# Patient Record
Sex: Male | Born: 1955 | Race: Black or African American | Hispanic: No
Health system: Southern US, Community
[De-identification: ages and names within clinical notes are randomized; demographics above are authoritative.]

## PROBLEM LIST (undated history)

## (undated) DIAGNOSIS — I10 Essential (primary) hypertension: Secondary | ICD-10-CM

## (undated) DIAGNOSIS — E7800 Pure hypercholesterolemia, unspecified: Secondary | ICD-10-CM

## (undated) HISTORY — DX: Essential (primary) hypertension: I10

## (undated) HISTORY — DX: Pure hypercholesterolemia, unspecified: E78.00

---

## 2009-09-17 ENCOUNTER — Inpatient Hospital Stay (HOSPITAL_COMMUNITY): Admission: EM | Admit: 2009-09-17 | Discharge: 2009-09-20 | Payer: Self-pay | Admitting: Emergency Medicine

## 2009-10-16 ENCOUNTER — Ambulatory Visit (HOSPITAL_COMMUNITY): Admission: RE | Admit: 2009-10-16 | Discharge: 2009-10-16 | Payer: Self-pay | Admitting: Neurosurgery
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
## 2011-02-20 LAB — BASIC METABOLIC PANEL
BUN: 16 mg/dL (ref 6–23)
Chloride: 104 mEq/L (ref 96–112)
Sodium: 135 mEq/L (ref 135–145)

## 2011-02-20 LAB — CBC: RDW: 14.5 % (ref 11.5–15.5)

## 2011-02-21 LAB — DIFFERENTIAL
Basophils Absolute: 0 10*3/uL (ref 0.0–0.1)
Basophils Relative: 0 % (ref 0–1)
Eosinophils Absolute: 0.1 10*3/uL (ref 0.0–0.7)
Lymphocytes Relative: 20 % (ref 12–46)
Monocytes Relative: 4 % (ref 3–12)
Neutro Abs: 8.2 10*3/uL — ABNORMAL HIGH (ref 1.7–7.7)
Neutrophils Relative %: 75 % (ref 43–77)

## 2011-02-21 LAB — POCT I-STAT, CHEM 8
Chloride: 102 mEq/L (ref 96–112)
Creatinine, Ser: 1.2 mg/dL (ref 0.4–1.5)
HCT: 48 % (ref 39.0–52.0)
Hemoglobin: 16.3 g/dL (ref 13.0–17.0)
TCO2: 27 mmol/L (ref 0–100)

## 2011-02-21 LAB — CBC
Hemoglobin: 14.8 g/dL (ref 13.0–17.0)
MCHC: 33.5 g/dL (ref 30.0–36.0)
Platelets: 138 10*3/uL — ABNORMAL LOW (ref 150–400)
RBC: 4.92 MIL/uL (ref 4.22–5.81)
RDW: 14.3 % (ref 11.5–15.5)
WBC: 11 10*3/uL — ABNORMAL HIGH (ref 4.0–10.5)

## 2011-02-21 LAB — PROTIME-INR: Prothrombin Time: 13.7 seconds (ref 11.6–15.2)

## 2018-07-29 ENCOUNTER — Encounter: Payer: Self-pay | Admitting: Gastroenterology

## 2018-09-07 ENCOUNTER — Ambulatory Visit: Payer: 59 | Admitting: Gastroenterology

## 2018-09-07 ENCOUNTER — Encounter: Payer: Self-pay | Admitting: Gastroenterology

## 2018-09-07 VITALS — BP 140/90 | HR 86 | Ht 74.0 in | Wt 91.4 lb

## 2018-09-07 DIAGNOSIS — R195 Other fecal abnormalities: Secondary | ICD-10-CM

## 2018-09-07 NOTE — Progress Notes (Signed)
Referring Provider: No ref. provider found Primary Care Physician:  Kaleen Mask, MD   Reason for Consultation:  Hemosure positive   IMPRESSION:  Hemosure positive No prior colon cancer screening  PLAN: Colonoscopy  I consented the patient at the bedside today discussing the risks, benefits, and alternatives to endoscopic evaluation. In particular, we discussed the risks that include, but are not limited to, reaction to medication, cardiopulmonary compromise, bleeding requiring blood transfusion, aspiration resulting in pneumonia, perforation requiring surgery, and even death. We reviewed the risk of missed lesion including polyps or even cancer. The patient acknowledges these risks and asks that we proceed.   HPI: Julian Hartman is a 62 y.o. male seen in consultation at the request of Dr. Jeannetta Nap. The history is obtained through the patient and review of his electronic health record.  He was found to have guaiac positive stools. He has no localizing signs or symptoms. GI ROS is negative.    Feels he is in good health. Rides his bike 30 miles each day.   No famiy history of coon cancer or colon polyps. No recent labs are available.   Past Medical History:  Diagnosis Date  . Elevated cholesterol   . Hypertension     History reviewed. No pertinent surgical history.  Prior to Admission medications   Not on File    Current Outpatient Medications  Medication Sig Dispense Refill  . lisinopril (PRINIVIL,ZESTRIL) 30 MG tablet Take 30 mg by mouth daily.    Marland Kitchen lovastatin (MEVACOR) 20 MG tablet Take 20 mg by mouth at bedtime.     No current facility-administered medications for this visit.     Allergies as of 09/07/2018 - Review Complete 09/07/2018  Allergen Reaction Noted  . Tusstat [diphenhydramine]  09/07/2018    History reviewed. No pertinent family history.  Social History   Socioeconomic History  . Marital status: Unknown    Spouse name: Not on file  .  Number of children: Not on file  . Years of education: Not on file  . Highest education level: Not on file  Occupational History  . Not on file  Social Needs  . Financial resource strain: Not on file  . Food insecurity:    Worry: Not on file    Inability: Not on file  . Transportation needs:    Medical: Not on file    Non-medical: Not on file  Tobacco Use  . Smoking status: Not on file  Substance and Sexual Activity  . Alcohol use: Not on file  . Drug use: Not on file  . Sexual activity: Not on file  Lifestyle  . Physical activity:    Days per week: Not on file    Minutes per session: Not on file  . Stress: Not on file  Relationships  . Social connections:    Talks on phone: Not on file    Gets together: Not on file    Attends religious service: Not on file    Active member of club or organization: Not on file    Attends meetings of clubs or organizations: Not on file    Relationship status: Not on file  . Intimate partner violence:    Fear of current or ex partner: Not on file    Emotionally abused: Not on file    Physically abused: Not on file    Forced sexual activity: Not on file  Other Topics Concern  . Not on file  Social History Narrative  . Not  on file    Review of Systems: 12 system ROS is negative except as noted above except for back pain.   Physical Exam:   General:   Alert, well-nourished, pleasant and cooperative in NAD Head:  Normocephalic and atraumatic. Eyes:  Sclera clear, no icterus.   Conjunctiva pink. Mouth:  No deformity or lesions.   Neck:  Supple; no thyromegaly. Lungs:  Clear throughout to auscultation.   No wheezes.  Heart:  Regular rate and rhythm; no murmurs Abdomen:  Soft, nontender, normal bowel sounds. No rebound or guarding. No hepatosplenomegaly Rectal:  Deferred  Msk:  Symmetrical without gross deformities. Extremities:  No gross deformities or edema. Neurologic:  Alert and  oriented x4;  grossly nonfocal Skin:  No rash or  bruise. Psych:  Alert and cooperative. Normal mood and affect.    Koron Godeaux L. Orvan Falconer Md, MPH  Gastroenterology 09/08/2018, 7:01 AM

## 2018-09-07 NOTE — Patient Instructions (Signed)

## 2018-09-14 ENCOUNTER — Encounter: Payer: Self-pay | Admitting: Gastroenterology

## 2018-09-28 ENCOUNTER — Ambulatory Visit (AMBULATORY_SURGERY_CENTER): Payer: 59 | Admitting: Gastroenterology

## 2018-09-28 ENCOUNTER — Encounter: Payer: Self-pay | Admitting: Gastroenterology

## 2018-09-28 VITALS — BP 143/75 | HR 55 | Temp 98.0°F | Resp 25 | Ht 74.0 in | Wt 185.0 lb

## 2018-09-28 DIAGNOSIS — K2970 Gastritis, unspecified, without bleeding: Secondary | ICD-10-CM

## 2018-09-28 DIAGNOSIS — K5730 Diverticulosis of large intestine without perforation or abscess without bleeding: Secondary | ICD-10-CM

## 2018-09-28 DIAGNOSIS — K2980 Duodenitis without bleeding: Secondary | ICD-10-CM | POA: Diagnosis not present

## 2018-09-28 DIAGNOSIS — R195 Other fecal abnormalities: Secondary | ICD-10-CM

## 2018-09-28 MED ORDER — PANTOPRAZOLE SODIUM 40 MG PO TBEC: 40.0000 mg | DELAYED_RELEASE_TABLET | Freq: Two times a day (BID) | ORAL | 0 refills | Status: AC

## 2018-09-28 MED ORDER — SODIUM CHLORIDE 0.9 % IV SOLN: 500.0000 mL | Freq: Once | INTRAVENOUS | Status: DC

## 2018-09-28 NOTE — Progress Notes (Signed)
PT taken to PACU. Monitors in place. VSS. Report given to RN. 

## 2018-09-28 NOTE — Patient Instructions (Addendum)
Handouts Provided:  Diverticulosis, Gastritis, and High Fiber Diet.    Avoid NSAIDS  YOU HAD AN ENDOSCOPIC PROCEDURE TODAY AT THE [facility]:   Refer to the procedure report that was given to you for any specific questions about what was found during the examination.  If the procedure report does not answer your questions, please call your gastroenterologist to clarify.  If you requested that your care partner not be given the details of your procedure findings, then the procedure report has been included in a sealed envelope for you to review at your convenience later.  YOU SHOULD EXPECT: Some feelings of bloating in the abdomen. Passage of more gas than usual.  Walking can help get rid of the air that was put into your GI tract during the procedure and reduce the bloating. If you had a lower endoscopy (such as a colonoscopy or flexible sigmoidoscopy) you may notice spotting of blood in your stool or on the toilet paper. If you underwent a bowel prep for your procedure, you may not have a normal bowel movement for a few days.  Please Note:  You might notice some irritation and congestion in your nose or some drainage.  This is from the oxygen used during your procedure.  There is no need for concern and it should clear up in a day or so.  SYMPTOMS TO REPORT IMMEDIATELY:   Following lower endoscopy (colonoscopy or flexible sigmoidoscopy):  Excessive amounts of blood in the stool  Significant tenderness or worsening of abdominal pains  Swelling of the abdomen that is new, acute  Fever of 100F or higher   Following upper endoscopy (EGD)  Vomiting of blood or coffee ground material  New chest pain or pain under the shoulder blades  Painful or persistently difficult swallowing  New shortness of breath  Fever of 100F or higher  Black, tarry-looking stools  For urgent or emergent issues, a gastroenterologist can be reached at any hour by calling [phone].   DIET:  We do  recommend a small meal at first, but then you may proceed to your regular diet.  Drink plenty of fluids but you should avoid alcoholic beverages for 24 hours.  ACTIVITY:  You should plan to take it easy for the rest of today and you should NOT DRIVE or use heavy machinery until tomorrow (because of the sedation medicines used during the test).    FOLLOW UP: Our staff will call the number listed on your records the next business day following your procedure to check on you and address any questions or concerns that you may have regarding the information given to you following your procedure. If we do not reach you, we will leave a message.  However, if you are feeling well and you are not experiencing any problems, there is no need to return our call.  We will assume that you have returned to your regular daily activities without incident.  If any biopsies were taken you will be contacted by phone or by letter within the next 1-3 weeks.  Please call us at [phone] if you have not heard about the biopsies in 3 weeks.    SIGNATURES/CONFIDENTIALITY: You and/or your care partner have signed paperwork which will be entered into your electronic medical record.  These signatures attest to the fact that that the information above on your After Visit Summary has been reviewed and is understood.  Full responsibility of the confidentiality of this discharge information lies with you  and/or your care-partner.

## 2018-09-28 NOTE — Op Note (Signed)
[facility] Patient Name: Julian Hartman Procedure Date: 09/28/2018 2:32 PM MRN: [id number] Endoscopist: Tressia Danas MD, MD Age: 62 Referring MD:  Date of Birth: Mar 17, 1956 Gender: Male Account #: [id number] Procedure:                Upper GI endoscopy Indications:              Hemosure positive. No associated symptoms. Medicines:                See the Anesthesia note for documentation of the                            administered medications Procedure:                Pre-Anesthesia Assessment:                           - Prior to the procedure, a History and Physical                            was performed, and patient medications and                            allergies were reviewed. The patient's tolerance of                            previous anesthesia was also reviewed. The risks                            and benefits of the procedure and the sedation                            options and risks were discussed with the patient.                            All questions were answered, and informed consent                            was obtained. Prior Anticoagulants: The patient has                            taken no previous anticoagulant or antiplatelet                            agents. ASA Grade Assessment: II - A patient with                            mild systemic disease. After reviewing the risks                            and benefits, the patient was deemed in                            satisfactory condition to undergo the procedure.  After obtaining informed consent, the endoscope was                            passed under direct vision. Throughout the                            procedure, the patient's blood pressure, pulse, and                            oxygen saturations were monitored continuously. The                            Endoscope was introduced through the mouth, and                            advanced to  the third part of duodenum. The upper                            GI endoscopy was accomplished without difficulty.                            The patient tolerated the procedure well. Scope In: Scope Out: Findings:                 The esophagus was normal.                           Diffuse mild inflammation was found in the gastric                            body. Biopsies were taken with a cold forceps for                            histology.                           Diffuse mildly erythematous mucosa without active                            bleeding and with no stigmata of bleeding was found                            in the duodenal bulb. Biopsies were taken with a                            cold forceps for histology.                           The exam was otherwise without abnormality. Complications:            No immediate complications. Estimated Blood Loss:     Estimated blood loss: none. Impression:               - Normal esophagus.                           -  Gastritis. Biopsied.                           - Erythematous duodenopathy. Biopsied.                           - The examination was otherwise normal. Recommendation:           - Await pathology results.                           - Avoid all NSAIDs.                           - Pantoprazole 40 mg by mouth twice daily for 8                            weeks.                           - Proceed with colonoscopy today as previously                            planned. Tressia Danas MD, MD 09/28/2018 3:04:39 PM This report has been signed electronically.

## 2018-09-28 NOTE — Op Note (Signed)
[facility] Patient Name: Julian Hartman Procedure Date: 09/28/2018 2:32 PM MRN: [id number] Endoscopist: Tressia Danas MD, MD Age: 62 Referring MD:  Date of Birth: 1955-12-29 Gender: Male Account #: [id number] Procedure:                Colonoscopy Indications:              Heme positive stool. No known family history of                            colon cancer or polyps. No baseline GI symptoms. Medicines:                See the Anesthesia note for documentation of the                            administered medications Procedure:                Pre-Anesthesia Assessment:                           - Prior to the procedure, a History and Physical                            was performed, and patient medications and                            allergies were reviewed. The patient's tolerance of                            previous anesthesia was also reviewed. The risks                            and benefits of the procedure and the sedation                            options and risks were discussed with the patient.                            All questions were answered, and informed consent                            was obtained. Prior Anticoagulants: The patient has                            taken no previous anticoagulant or antiplatelet                            agents. ASA Grade Assessment: II - A patient with                            mild systemic disease. After reviewing the risks                            and benefits, the patient was deemed in  satisfactory condition to undergo the procedure.                           After obtaining informed consent, the colonoscope                            was passed under direct vision. Throughout the                            procedure, the patient's blood pressure, pulse, and                            oxygen saturations were monitored continuously. The                            Model  CF-HQ190L [id number]) scope was introduced                            through the anus and advanced to the the terminal                            ileum, with identification of the appendiceal                            orifice and IC valve. The colonoscopy was performed                            without difficulty. The patient tolerated the                            procedure well. The quality of the bowel                            preparation was good. Scope In: 2:44:36 PM Scope Out: 2:59:44 PM Scope Withdrawal Time: 0 hours 12 minutes 23 seconds  Total Procedure Duration: 0 hours 15 minutes 8 seconds  Findings:                 The perianal and digital rectal examinations were                            normal.                           Multiple small and large-mouthed diverticula were                            found in the sigmoid colon, descending colon,                            transverse colon and ascending colon.                           The exam was otherwise without abnormality on  direct and retroflexion views. Complications:            No immediate complications. Estimated Blood Loss:     Estimated blood loss: none. Impression:               - Diverticulosis in the sigmoid colon, in the                            descending colon, in the transverse colon and in                            the ascending colon.                           - The examination was otherwise normal on direct                            and retroflexion views.                           - No specimens collected. Recommendation:           - Discharge patient to home.                           - Resume regular diet today. High-fiber diet                            recommended.                           - Continue present medications.                           - Repeat colonoscopy in 10 years for screening                            purposes or earlier with the onset of any  new                            symptoms. Tressia Danas MD, MD 09/28/2018 3:07:00 PM This report has been signed electronically.

## 2018-09-28 NOTE — Progress Notes (Signed)
Called to room to assist during endoscopic procedure.  Patient ID and intended procedure confirmed with present staff. Received instructions for my participation in the procedure from the performing physician.  

## 2018-09-28 NOTE — Progress Notes (Signed)
I have reviewed the patient's medical history in detail and updated the computerized patient record.

## 2018-09-29 ENCOUNTER — Telehealth: Payer: Self-pay | Admitting: *Deleted

## 2018-09-29 NOTE — Telephone Encounter (Signed)
  Follow up Call-  Call back number 09/28/2018  Post procedure Call Back phone  # [phone]  Permission to leave phone message Yes  Some recent data might be hidden     Patient questions:  Do you have a fever, pain , or abdominal swelling? No. Pain Score  0 *  Have you tolerated food without any problems? Yes.    Have you been able to return to your normal activities? Yes.    Do you have any questions about your discharge instructions: Diet   No. Medications  No. Follow up visit  No.  Do you have questions or concerns about your Care? No.  Actions: * If pain score is 4 or above: No action needed, pain <4.

## 2018-10-19 ENCOUNTER — Encounter: Payer: Self-pay | Admitting: Gastroenterology

## 2018-10-28 ENCOUNTER — Telehealth: Payer: Self-pay | Admitting: Gastroenterology

## 2018-10-28 NOTE — Telephone Encounter (Signed)
Left message for patient to call back  

## 2018-10-28 NOTE — Telephone Encounter (Signed)
Patient called my phone directly and stated his colonoscopy was "coded wrong" according to what his insurance told him.  He said it should be preventative screening.  I told him that I could not help him with the codes that are sent in after procedure is performed.  I also told him that the diagnosis code I see is positive stool. (came in for problem in October)  Advised him that this code is not considered as preventative.  He insisted that the problem he came in for was irrelevant to doing the colonoscopy.  I advised him that records go in with the claim and that insurance can deny covering as preventative at their discretion.  He insisted that he wanted to speak to the doctor or nurse that could look into possibly getting code changed.  I told him I would send a note back.

## 2018-10-30 NOTE — Telephone Encounter (Signed)
I explained to the patient that his procedure was performed due to heme positive stools and was a diagnostic and not a screening procedure. He verbalized understanding.  He will call back for any additional questions or concerns.

## 2020-01-27 ENCOUNTER — Ambulatory Visit: Payer: 59 | Attending: Internal Medicine

## 2020-01-27 DIAGNOSIS — Z23 Encounter for immunization: Secondary | ICD-10-CM

## 2020-01-27 NOTE — Progress Notes (Signed)
   Covid-19 Vaccination Clinic  Name:  Dayvin Aber    MRN: [id number] DOB: [birth year]  01/27/2020  Mr. Gille was observed post Covid-19 immunization for 15 minutes without incident. He was provided with Vaccine Information Sheet and instruction to access the V-Safe system.   Mr. Feagans was instructed to call 911 with any severe reactions post vaccine: Marland Kitchen Difficulty breathing  . Swelling of face and throat  . A fast heartbeat  . A bad rash all over body  . Dizziness and weakness   Immunizations Administered    Name Date Dose VIS Date Route   Pfizer COVID-19 Vaccine 01/27/2020 10:59 AM 0.3 mL 10/29/2019 Intramuscular   Manufacturer: [employer], [employer]   Lot: [id number]   NDC: [address]-0

## 2020-02-21 ENCOUNTER — Ambulatory Visit: Payer: 59 | Attending: Internal Medicine

## 2020-02-21 DIAGNOSIS — Z23 Encounter for immunization: Secondary | ICD-10-CM

## 2020-02-21 NOTE — Progress Notes (Signed)
   Covid-19 Vaccination Clinic  Name:  Julian Hartman    MRN: [id number] DOB: [birth year]  02/21/2020  [name] was observed post Covid-19 immunization for 15 minutes without incident. He was provided with Vaccine Information Sheet and instruction to access the V-Safe system.   [name] was instructed to call 911 with any severe reactions post vaccine: Marland Kitchen Difficulty breathing  . Swelling of face and throat  . A fast heartbeat  . A bad rash all over body  . Dizziness and weakness   Immunizations Administered    Name Date Dose VIS Date Route   Pfizer COVID-19 Vaccine 02/21/2020  9:35 AM 0.3 mL 10/29/2019 Intramuscular   Manufacturer: [employer], [employer]   Lot: [id number]   NDC: [address]-0
